# Patient Record
Sex: Female | Born: 1970
Health system: Southern US, Community
[De-identification: ages and names within clinical notes are randomized; demographics above are authoritative.]

## PROBLEM LIST (undated history)

## (undated) DIAGNOSIS — Z789 Other specified health status: Secondary | ICD-10-CM

## (undated) HISTORY — DX: Other specified health status: Z78.9

## (undated) HISTORY — PX: NO PAST SURGERIES: SHX2092

---

## 2016-09-14 DIAGNOSIS — L738 Other specified follicular disorders: Secondary | ICD-10-CM | POA: Diagnosis not present

## 2016-09-14 DIAGNOSIS — L720 Epidermal cyst: Secondary | ICD-10-CM | POA: Diagnosis not present

## 2016-09-14 DIAGNOSIS — D229 Melanocytic nevi, unspecified: Secondary | ICD-10-CM | POA: Diagnosis not present

## 2016-11-04 DIAGNOSIS — R52 Pain, unspecified: Secondary | ICD-10-CM | POA: Diagnosis not present

## 2016-11-30 ENCOUNTER — Other Ambulatory Visit: Payer: Self-pay

## 2016-11-30 DIAGNOSIS — Z0000 Encounter for general adult medical examination without abnormal findings: Secondary | ICD-10-CM

## 2016-12-01 LAB — CMP12+LP+TP+TSH+6AC+CBC/D/PLT
A/G RATIO: 1.9 (ref 1.2–2.2)
ALT: 16 IU/L (ref 0–32)
AST: 15 IU/L (ref 0–40)
Albumin: 4.3 g/dL (ref 3.5–5.5)
Alkaline Phosphatase: 60 IU/L (ref 39–117)
BASOS ABS: 0 10*3/uL (ref 0.0–0.2)
BUN/Creatinine Ratio: 16 (ref 9–23)
BUN: 11 mg/dL (ref 6–24)
Basos: 1 %
Bilirubin Total: <0.2 mg/dL (ref 0.0–1.2)
CHOL/HDL RATIO: 2.9 ratio (ref 0.0–4.4)
CHOLESTEROL TOTAL: 167 mg/dL (ref 100–199)
Calcium: 9.3 mg/dL (ref 8.7–10.2)
Chloride: 105 mmol/L (ref 96–106)
Creatinine, Ser: 0.68 mg/dL (ref 0.57–1.00)
EOS (ABSOLUTE): 0.1 10*3/uL (ref 0.0–0.4)
Eos: 4 %
Estimated CHD Risk: <0.5 times avg. (ref 0.0–1.0)
FREE THYROXINE INDEX: 1.7 (ref 1.2–4.9)
GFR calc non Af Amer: 104 mL/min/{1.73_m2} (ref >59)
GFR, EST AFRICAN AMERICAN: 120 mL/min/{1.73_m2} (ref >59)
GGT: 13 IU/L (ref 0–60)
GLOBULIN, TOTAL: 2.3 g/dL (ref 1.5–4.5)
Glucose: 98 mg/dL (ref 65–99)
HDL: 58 mg/dL (ref >39)
Hematocrit: 38.9 % (ref 34.0–46.6)
Hemoglobin: 13.2 g/dL (ref 11.1–15.9)
IMMATURE GRANS (ABS): 0 10*3/uL (ref 0.0–0.1)
IRON: 48 ug/dL (ref 27–159)
Immature Granulocytes: 0 %
LDH: 101 IU/L — AB (ref 119–226)
LDL Calculated: 98 mg/dL (ref 0–99)
LYMPHS: 28 %
Lymphocytes Absolute: 1 10*3/uL (ref 0.7–3.1)
MCH: 32.2 pg (ref 26.6–33.0)
MCHC: 33.9 g/dL (ref 31.5–35.7)
MCV: 95 fL (ref 79–97)
MONOCYTES: 8 %
MONOS ABS: 0.3 10*3/uL (ref 0.1–0.9)
Neutrophils Absolute: 2.2 10*3/uL (ref 1.4–7.0)
Neutrophils: 59 %
PHOSPHORUS: 3.3 mg/dL (ref 2.5–4.5)
PLATELETS: 193 10*3/uL (ref 150–379)
Potassium: 4.3 mmol/L (ref 3.5–5.2)
RBC: 4.1 x10E6/uL (ref 3.77–5.28)
RDW: 13.2 % (ref 12.3–15.4)
Sodium: 143 mmol/L (ref 134–144)
T3 UPTAKE RATIO: 30 % (ref 24–39)
T4 TOTAL: 5.5 ug/dL (ref 4.5–12.0)
TRIGLYCERIDES: 56 mg/dL (ref 0–149)
TSH: 1.2 u[IU]/mL (ref 0.450–4.500)
Total Protein: 6.6 g/dL (ref 6.0–8.5)
Uric Acid: 3.9 mg/dL (ref 2.5–7.1)
VLDL Cholesterol Cal: 11 mg/dL (ref 5–40)
WBC: 3.6 10*3/uL (ref 3.4–10.8)

## 2016-12-01 LAB — VITAMIN D 25 HYDROXY (VIT D DEFICIENCY, FRACTURES): VIT D 25 HYDROXY: 38.2 ng/mL (ref 30.0–100.0)

## 2016-12-02 LAB — HGB A1C W/O EAG: HEMOGLOBIN A1C: 4.8 % (ref 4.8–5.6)

## 2016-12-02 LAB — SPECIMEN STATUS REPORT

## 2016-12-08 ENCOUNTER — Encounter: Payer: Self-pay | Admitting: Medical

## 2016-12-28 ENCOUNTER — Ambulatory Visit (INDEPENDENT_AMBULATORY_CARE_PROVIDER_SITE_OTHER): Payer: BLUE CROSS/BLUE SHIELD | Admitting: Obstetrics and Gynecology

## 2016-12-28 ENCOUNTER — Encounter: Payer: Self-pay | Admitting: Obstetrics and Gynecology

## 2016-12-28 DIAGNOSIS — Z1211 Encounter for screening for malignant neoplasm of colon: Secondary | ICD-10-CM | POA: Diagnosis not present

## 2016-12-28 DIAGNOSIS — Z1231 Encounter for screening mammogram for malignant neoplasm of breast: Secondary | ICD-10-CM | POA: Diagnosis not present

## 2016-12-28 DIAGNOSIS — Z1239 Encounter for other screening for malignant neoplasm of breast: Secondary | ICD-10-CM

## 2016-12-28 DIAGNOSIS — Z124 Encounter for screening for malignant neoplasm of cervix: Secondary | ICD-10-CM

## 2016-12-28 DIAGNOSIS — Z01419 Encounter for gynecological examination (general) (routine) without abnormal findings: Secondary | ICD-10-CM | POA: Diagnosis not present

## 2016-12-28 NOTE — Patient Instructions (Signed)
Screening recommended starting at age 48 for average risk individuals, age 40 for individuals deemed at increased risk (including African Americans) and recommended to continue until age 75.  For patient age 76-85 individualized approach is recommended.  Gold standard screening is via colonoscopy, Cologuard screening is an acceptable alternative for patient unwilling or unable to undergo colonoscopy.  "Colorectal cancer screening for average?risk adults: 2018 guideline update from the American Cancer Society"CA: A Cancer Journal for Clinicians: Nov 30, 2016    Preventive Care 40-64 Years, Female Preventive care refers to lifestyle choices and visits with your health care provider that can promote health and wellness. What does preventive care include?  A yearly physical exam. This is also called an annual well check.  Dental exams once or twice a year.  Routine eye exams. Ask your health care provider how often you should have your eyes checked.  Personal lifestyle choices, including: ? Daily care of your teeth and gums. ? Regular physical activity. ? Eating a healthy diet. ? Avoiding tobacco and drug use. ? Limiting alcohol use. ? Practicing safe sex. ? Taking low-dose aspirin daily starting at age 50. ? Taking vitamin and mineral supplements as recommended by your health care provider. What happens during an annual well check? The services and screenings done by your health care provider during your annual well check will depend on your age, overall health, lifestyle risk factors, and family history of disease. Counseling Your health care provider may ask you questions about your:  Alcohol use.  Tobacco use.  Drug use.  Emotional well-being.  Home and relationship well-being.  Sexual activity.  Eating habits.  Work and work environment.  Method of birth control.  Menstrual cycle.  Pregnancy history.  Screening You may have the following tests or  measurements:  Height, weight, and BMI.  Blood pressure.  Lipid and cholesterol levels. These may be checked every 5 years, or more frequently if you are over 50 years old.  Skin check.  Lung cancer screening. You may have this screening every year starting at age 55 if you have a 30-pack-year history of smoking and currently smoke or have quit within the past 15 years.  Fecal occult blood test (FOBT) of the stool. You may have this test every year starting at age 50.  Flexible sigmoidoscopy or colonoscopy. You may have a sigmoidoscopy every 5 years or a colonoscopy every 10 years starting at age 50.  Hepatitis C blood test.  Hepatitis B blood test.  Sexually transmitted disease (STD) testing.  Diabetes screening. This is done by checking your blood sugar (glucose) after you have not eaten for a while (fasting). You may have this done every 1-3 years.  Mammogram. This may be done every 1-2 years. Talk to your health care provider about when you should start having regular mammograms. This may depend on whether you have a family history of breast cancer.  BRCA-related cancer screening. This may be done if you have a family history of breast, ovarian, tubal, or peritoneal cancers.  Pelvic exam and Pap test. This may be done every 3 years starting at age 21. Starting at age 30, this may be done every 5 years if you have a Pap test in combination with an HPV test.  Bone density scan. This is done to screen for osteoporosis. You may have this scan if you are at high risk for osteoporosis.  Discuss your test results, treatment options, and if necessary, the need for more tests with your   health care provider. Vaccines Your health care provider may recommend certain vaccines, such as:  Influenza vaccine. This is recommended every year.  Tetanus, diphtheria, and acellular pertussis (Tdap, Td) vaccine. You may need a Td booster every 10 years.  Varicella vaccine. You may need this if  you have not been vaccinated.  Zoster vaccine. You may need this after age 60.  Measles, mumps, and rubella (MMR) vaccine. You may need at least one dose of MMR if you were born in 1957 or later. You may also need a second dose.  Pneumococcal 13-valent conjugate (PCV13) vaccine. You may need this if you have certain conditions and were not previously vaccinated.  Pneumococcal polysaccharide (PPSV23) vaccine. You may need one or two doses if you smoke cigarettes or if you have certain conditions.  Meningococcal vaccine. You may need this if you have certain conditions.  Hepatitis A vaccine. You may need this if you have certain conditions or if you travel or work in places where you may be exposed to hepatitis A.  Hepatitis B vaccine. You may need this if you have certain conditions or if you travel or work in places where you may be exposed to hepatitis B.  Haemophilus influenzae type b (Hib) vaccine. You may need this if you have certain conditions.  Talk to your health care provider about which screenings and vaccines you need and how often you need them. This information is not intended to replace advice given to you by your health care provider. Make sure you discuss any questions you have with your health care provider. Document Released: 07/17/2015 Document Revised: 03/09/2016 Document Reviewed: 04/21/2015 Elsevier Interactive Patient Education  2017 Elsevier Inc.  

## 2016-12-28 NOTE — Progress Notes (Addendum)
Patient ID: Wanda Williamson, female   DOB: 07/18/1970, 46 y.o.   MRN: [id number]     Gynecology Annual Exam  PCP: Doy Minceatcliffe, Heather R, PA-C  Chief Complaint:  Chief Complaint  Patient presents with  . Gynecologic Exam    discuss perimenopause    History of Present Illness: Patient is a 46 y.o. G0P0000 presents for annual exam. The patient has no complaints today.   LMP: No LMP recorded. Patient is not currently having periods (Reason: Perimenopausal). She has noted some mild vasomotor symptoms.  Periods have spaced out to about every 3-4 months, when they occur lasting 5-6 days, normal flow, no intermenstrual spotting.    The patient is sexually active. She currently uses nothing for contraception. She denies dyspareunia.  The patient does perform self breast exams.  There is no notable family history of breast or ovarian cancer in her family.  The patient wears seatbelts: yes.   The patient has regular exercise: yes.    The patient denies current symptoms of depression.    Review of Systems: Review of Systems  Constitutional: Negative for chills and fever.  HENT: Negative for congestion.   Respiratory: Negative for cough and shortness of breath.   Cardiovascular: Negative for chest pain and palpitations.  Gastrointestinal: Negative for abdominal pain, constipation, diarrhea, heartburn, nausea and vomiting.  Genitourinary: Negative for dysuria, frequency and urgency.  Skin: Negative for itching and rash.  Neurological: Negative for dizziness and headaches.  Endo/Heme/Allergies: Negative for polydipsia.  Psychiatric/Behavioral: Negative for depression.    Past Medical History:  History reviewed. No pertinent past medical history.  Past Surgical History:  History reviewed. No pertinent surgical history.  Gynecologic History:  No LMP recorded. Patient is not currently having periods (Reason: Perimenopausal).  Obstetric History: G0P0000  Family History:  History reviewed.  No pertinent family history.  Social History:  Social History   Social History  . Marital status: Single    Spouse name: N/A  . Number of children: N/A  . Years of education: N/A   Occupational History  . Not on file.   Social History Main Topics  . Smoking status: Current Every Day Smoker    Types: Cigarettes  . Smokeless tobacco: Never Used  . Alcohol use Yes  . Drug use: No  . Sexual activity: Yes    Partners: Male   Other Topics Concern  . Not on file   Social History Narrative  . No narrative on file    Allergies:  Allergies  Allergen Reactions  . Cat Hair Extract Rash  . Penicillin G Rash    Medications: Prior to Admission medications   Not on File    Physical Exam Vitals: Blood pressure 122/70, pulse 83, height 5\' 10"  (1.778 m), weight 151 lb (68.5 kg).  General: NAD HEENT: normocephalic, anicteric Thyroid: no enlargement, no palpable nodules Pulmonary: No increased work of breathing, CTAB Cardiovascular: RRR, distal pulses 2+ Breast: Breast symmetrical, no tenderness, no palpable nodules or masses, no skin or nipple retraction present, no nipple discharge.  No axillary or supraclavicular lymphadenopathy. Abdomen: NABS, soft, non-tender, non-distended.  Umbilicus without lesions.  No hepatomegaly, splenomegaly or masses palpable. No evidence of hernia  Genitourinary:  External: Normal external female genitalia.  Normal urethral meatus, normal  Bartholin's and Skene's glands.    Vagina: Normal vaginal mucosa, no evidence of prolapse.    Cervix: Grossly normal in appearance, no bleeding  Uterus: Non-enlarged, mobile, normal contour.  No CMT  Adnexa: ovaries non-enlarged, no adnexal  masses  Rectal: deferred  Lymphatic: no evidence of inguinal lymphadenopathy Extremities: no edema, erythema, or tenderness Neurologic: Grossly intact Psychiatric: mood appropriate, affect full  Female chaperone present for pelvic and breast  portions of the physical  exam    Assessment: 46 y.o. routine annual exam Plan: Problem List Items Addressed This Visit    None    Visit Diagnoses    Screening for malignant neoplasm of cervix       Relevant Orders   PapIG, HPV, rfx 16/18   Breast screening       Relevant Orders   MM DIGITAL SCREENING BILATERAL   Special screening for malignant neoplasms, colon       Encounter for gynecological examination without abnormal finding       Relevant Orders   PapIG, HPV, rfx 16/18      1) Mammogram - recommend yearly screening mammogram.  Mammogram Was ordered today  2) ASCCP guidelines and rational discussed.  Patient opts for every 3 years screening interval  3) Colonoscopy -- Screening recommended starting at age 44 for average risk individuals, age 59 for individuals deemed at increased risk (including African Americans) and recommended to continue until age 42.  For patient age 16-85 individualized approach is recommended.  Gold standard screening is via colonoscopy, Cologuard screening is an acceptable alternative for patient unwilling or unable to undergo colonoscopy.  "Colorectal cancer screening for average?risk adults: 2018 guideline update from the American Cancer Society"CA: A Cancer Journal for Clinicians: Nov 30, 2016  - interested in cologuard  4) Routine healthcare maintenance including cholesterol, diabetes screening discussed managed by PCP   5) Follow up 1 year for repeat Annual

## 2017-01-03 ENCOUNTER — Encounter: Payer: Self-pay | Admitting: Obstetrics and Gynecology

## 2017-01-03 LAB — PAPIG, HPV, RFX 16/18
HPV, high-risk: NEGATIVE
PAP SMEAR COMMENT: 0

## 2017-07-12 DIAGNOSIS — Z1329 Encounter for screening for other suspected endocrine disorder: Secondary | ICD-10-CM | POA: Diagnosis not present

## 2017-07-12 DIAGNOSIS — Z0000 Encounter for general adult medical examination without abnormal findings: Secondary | ICD-10-CM | POA: Diagnosis not present

## 2017-07-12 DIAGNOSIS — Z1231 Encounter for screening mammogram for malignant neoplasm of breast: Secondary | ICD-10-CM | POA: Diagnosis not present

## 2017-07-12 DIAGNOSIS — Z13220 Encounter for screening for lipoid disorders: Secondary | ICD-10-CM | POA: Diagnosis not present

## 2017-07-17 ENCOUNTER — Other Ambulatory Visit: Payer: Self-pay | Admitting: Internal Medicine

## 2017-07-17 DIAGNOSIS — Z1231 Encounter for screening mammogram for malignant neoplasm of breast: Secondary | ICD-10-CM

## 2017-08-01 ENCOUNTER — Ambulatory Visit
Admission: RE | Admit: 2017-08-01 | Discharge: 2017-08-01 | Disposition: A | Discharge status: Home / Self Care | Payer: BLUE CROSS/BLUE SHIELD | Source: Ambulatory Visit | Attending: Internal Medicine | Admitting: Internal Medicine

## 2017-08-01 ENCOUNTER — Encounter: Payer: Self-pay | Admitting: Radiology

## 2017-08-01 DIAGNOSIS — Z1231 Encounter for screening mammogram for malignant neoplasm of breast: Secondary | ICD-10-CM | POA: Diagnosis not present

## 2017-12-29 DIAGNOSIS — M47816 Spondylosis without myelopathy or radiculopathy, lumbar region: Secondary | ICD-10-CM | POA: Diagnosis not present

## 2017-12-29 DIAGNOSIS — M5489 Other dorsalgia: Secondary | ICD-10-CM | POA: Diagnosis not present

## 2018-05-08 DIAGNOSIS — R202 Paresthesia of skin: Secondary | ICD-10-CM | POA: Diagnosis not present

## 2018-05-08 DIAGNOSIS — M25511 Pain in right shoulder: Secondary | ICD-10-CM | POA: Diagnosis not present

## 2018-05-08 DIAGNOSIS — M4802 Spinal stenosis, cervical region: Secondary | ICD-10-CM | POA: Diagnosis not present

## 2018-05-21 DIAGNOSIS — S46911A Strain of unspecified muscle, fascia and tendon at shoulder and upper arm level, right arm, initial encounter: Secondary | ICD-10-CM | POA: Diagnosis not present

## 2018-05-21 DIAGNOSIS — R200 Anesthesia of skin: Secondary | ICD-10-CM | POA: Diagnosis not present

## 2018-07-03 ENCOUNTER — Other Ambulatory Visit: Payer: Self-pay | Admitting: Obstetrics and Gynecology

## 2018-07-03 DIAGNOSIS — Z1231 Encounter for screening mammogram for malignant neoplasm of breast: Secondary | ICD-10-CM

## 2018-07-20 ENCOUNTER — Ambulatory Visit (INDEPENDENT_AMBULATORY_CARE_PROVIDER_SITE_OTHER): Payer: BLUE CROSS/BLUE SHIELD | Admitting: Obstetrics and Gynecology

## 2018-07-20 ENCOUNTER — Other Ambulatory Visit (HOSPITAL_COMMUNITY)
Admission: RE | Admit: 2018-07-20 | Discharge: 2018-07-20 | Disposition: A | Discharge status: Home / Self Care | Payer: BLUE CROSS/BLUE SHIELD | Source: Ambulatory Visit | Attending: Obstetrics and Gynecology | Admitting: Obstetrics and Gynecology

## 2018-07-20 ENCOUNTER — Encounter: Payer: Self-pay | Admitting: Obstetrics and Gynecology

## 2018-07-20 VITALS — BP 110/62 | HR 76 | Ht 70.5 in | Wt 141.0 lb

## 2018-07-20 DIAGNOSIS — Z13220 Encounter for screening for lipoid disorders: Secondary | ICD-10-CM | POA: Diagnosis not present

## 2018-07-20 DIAGNOSIS — Z01419 Encounter for gynecological examination (general) (routine) without abnormal findings: Secondary | ICD-10-CM

## 2018-07-20 DIAGNOSIS — Z1239 Encounter for other screening for malignant neoplasm of breast: Secondary | ICD-10-CM

## 2018-07-20 DIAGNOSIS — Z124 Encounter for screening for malignant neoplasm of cervix: Secondary | ICD-10-CM | POA: Diagnosis not present

## 2018-07-20 DIAGNOSIS — Z0001 Encounter for general adult medical examination with abnormal findings: Secondary | ICD-10-CM | POA: Diagnosis not present

## 2018-07-20 NOTE — Progress Notes (Signed)
Gynecology Annual Exam  PCP: Patient, No Pcp Per  Chief Complaint:  Chief Complaint  Patient presents with  . Gynecologic Exam    Pain around right breast/armpit    History of Present Illness: Patient is a 48 y.o. G0P0000 presents for annual exam. The patient has no complaints today.   LMP: No LMP recorded. Patient is postmenopausal. No postmenopausal bleeding, no significant vasomotor symptoms   The patient does perform self breast exams.  There is no notable family history of breast or ovarian cancer in her family.  The patient wears seatbelts: yes.   The patient has regular exercise: not asked.    The patient denies current symptoms of depression.    Review of Systems: Review of Systems  Constitutional: Negative for chills and fever.  HENT: Negative for congestion.   Respiratory: Negative for cough and shortness of breath.   Cardiovascular: Negative for chest pain and palpitations.  Gastrointestinal: Negative for abdominal pain, constipation, diarrhea, heartburn, nausea and vomiting.  Genitourinary: Negative for dysuria, frequency and urgency.  Skin: Negative for itching and rash.  Neurological: Negative for dizziness and headaches.  Endo/Heme/Allergies: Negative for polydipsia.  Psychiatric/Behavioral: Negative for depression.    Past Medical History:  Past Medical History:  Diagnosis Date  . No known health problems     Past Surgical History:  Past Surgical History:  Procedure Laterality Date  . NO PAST SURGERIES      Gynecologic History:  No LMP recorded. Patient is postmenopausal. Contraception: post menopausal status Last Pap: Results were: 12/28/2016 NIL and HR HPV negative  Last mammogram: 08/01/2017 Results were: BI-RAD I  Obstetric History: G0P0000  Family History:  History reviewed. No pertinent family history.  Social History:  Social History   Socioeconomic History  . Marital status: Single    Spouse name: Not on file  . Number of  children: Not on file  . Years of education: Not on file  . Highest education level: Not on file  Occupational History  . Not on file  Social Needs  . Financial resource strain: Not on file  . Food insecurity:    Worry: Not on file    Inability: Not on file  . Transportation needs:    Medical: Not on file    Non-medical: Not on file  Tobacco Use  . Smoking status: Current Every Day Smoker    Types: Cigarettes  . Smokeless tobacco: Never Used  Substance and Sexual Activity  . Alcohol use: Yes  . Drug use: No  . Sexual activity: Yes    Partners: Male  Lifestyle  . Physical activity:    Days per week: Not on file    Minutes per session: Not on file  . Stress: Not on file  Relationships  . Social connections:    Talks on phone: Not on file    Gets together: Not on file    Attends religious service: Not on file    Active member of club or organization: Not on file    Attends meetings of clubs or organizations: Not on file    Relationship status: Not on file  . Intimate partner violence:    Fear of current or ex partner: Not on file    Emotionally abused: Not on file    Physically abused: Not on file    Forced sexual activity: Not on file  Other Topics Concern  . Not on file  Social History Narrative  . Not on file  Allergies:  Allergies  Allergen Reactions  . Cat Hair Extract Rash  . Penicillin G Rash    Medications: Prior to Admission medications   Not on File    Physical Exam Vitals: Blood pressure 110/62, pulse 76, height 5' 10.5" (1.791 m), weight 141 lb (64 kg). Body mass index is 19.95 kg/m. Weight last year 151lbs  General: NAD HEENT: normocephalic, anicteric Thyroid: no enlargement, no palpable nodules Pulmonary: No increased work of breathing, CTAB Cardiovascular: RRR, distal pulses 2+ Breast: Breast symmetrical, no tenderness, no palpable nodules or masses, no skin or nipple retraction present, no nipple discharge.  No axillary or  supraclavicular lymphadenopathy. Abdomen: NABS, soft, non-tender, non-distended.  Umbilicus without lesions.  No hepatomegaly, splenomegaly or masses palpable. No evidence of hernia  Genitourinary:  External: Normal external female genitalia.  Normal urethral meatus, normal Bartholin's and Skene's glands.    Vagina: Normal vaginal mucosa, no evidence of prolapse.    Cervix: Grossly normal in appearance, no bleeding  Uterus: Non-enlarged, mobile, normal contour.  No CMT  Adnexa: ovaries non-enlarged, no adnexal masses  Rectal: deferred  Lymphatic: no evidence of inguinal lymphadenopathy Extremities: no edema, erythema, or tenderness Neurologic: Grossly intact Psychiatric: mood appropriate, affect full  Female chaperone present for pelvic and breast  portions of the physical exam    Assessment: 48 y.o. G0P0000 routine annual exam  Plan: Problem List Items Addressed This Visit    None    Visit Diagnoses    Encounter for gynecological examination without abnormal finding    -  Primary   Breast screening       Relevant Orders   MM 3D SCREEN BREAST BILATERAL   Screening for malignant neoplasm of cervix       Relevant Orders   Cytology - PAP      1) Mammogram - recommend yearly screening mammogram.  Mammogram Was ordered today   2) STI screening  was notoffered and therefore not obtained  3) ASCCP guidelines and rational discussed.  Patient opts for every 3 years screening interval  4) Contraception - the patient is currently using  post menopausal status.  She is not currently in need of contraception secondary to being sterile  5) Colonoscopy -- discussed obtaining this year will likely get in [place] where she plan to move back to this summer  6) Routine healthcare maintenance including cholesterol, diabetes screening discussed managed by PCP  7) Return in about 1 year (around 07/21/2019) for annual.   Vena Austria, MD, [facility], [facility] 07/20/2018, 1:44 PM

## 2018-07-20 NOTE — Patient Instructions (Signed)
[facility] [address] [place] [address]  [facility]  [address] [place] [address]  Phone: [phone]  

## 2018-07-25 LAB — CYTOLOGY - PAP
Adequacy: ABSENT
Diagnosis: NEGATIVE
HPV: NOT DETECTED

## 2018-08-01 DIAGNOSIS — D485 Neoplasm of uncertain behavior of skin: Secondary | ICD-10-CM | POA: Diagnosis not present

## 2018-08-09 ENCOUNTER — Ambulatory Visit
Admission: RE | Admit: 2018-08-09 | Discharge: 2018-08-09 | Disposition: A | Discharge status: Home / Self Care | Payer: BLUE CROSS/BLUE SHIELD | Source: Ambulatory Visit | Attending: Obstetrics and Gynecology | Admitting: Obstetrics and Gynecology

## 2018-08-09 DIAGNOSIS — Z1231 Encounter for screening mammogram for malignant neoplasm of breast: Secondary | ICD-10-CM

## 2018-08-09 DIAGNOSIS — Z1239 Encounter for other screening for malignant neoplasm of breast: Secondary | ICD-10-CM

## 2019-12-02 IMAGING — MG DIGITAL SCREENING BILATERAL MAMMOGRAM WITH TOMO AND CAD
8 series · 9 of 24 positions shown · non-contrast
Comparison: Previous exam(s).

CLINICAL DATA: Screening.

EXAM:
DIGITAL SCREENING BILATERAL MAMMOGRAM WITH TOMO AND CAD

[L CC synth-2D]
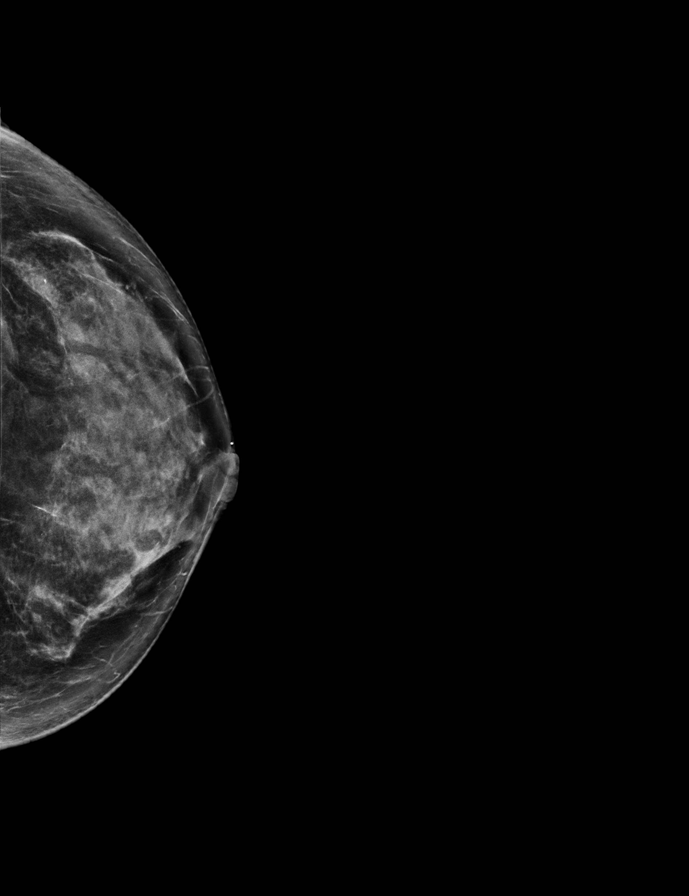

[R CC synth-2D]
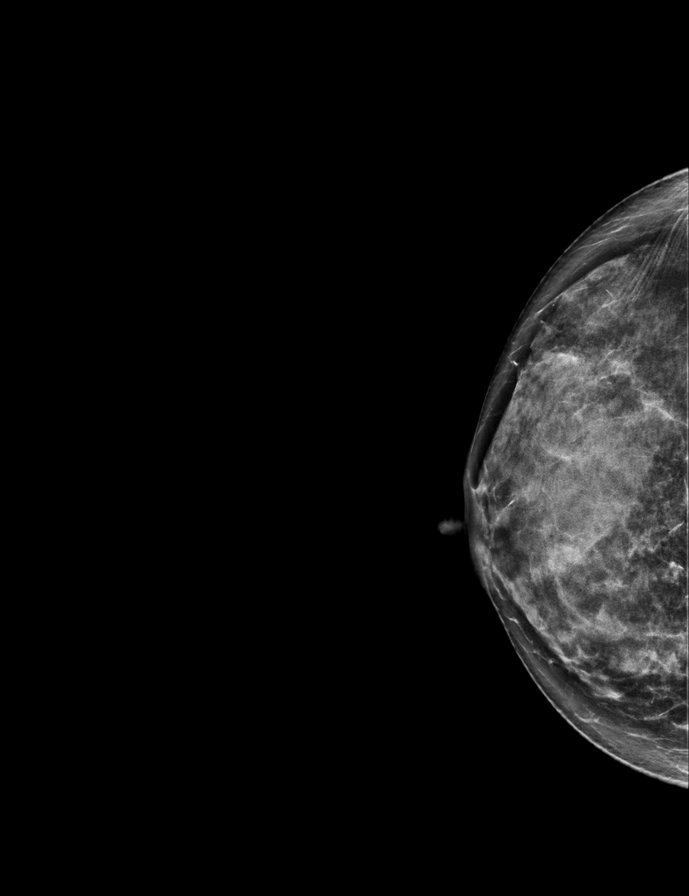

[R MLO synth-2D]
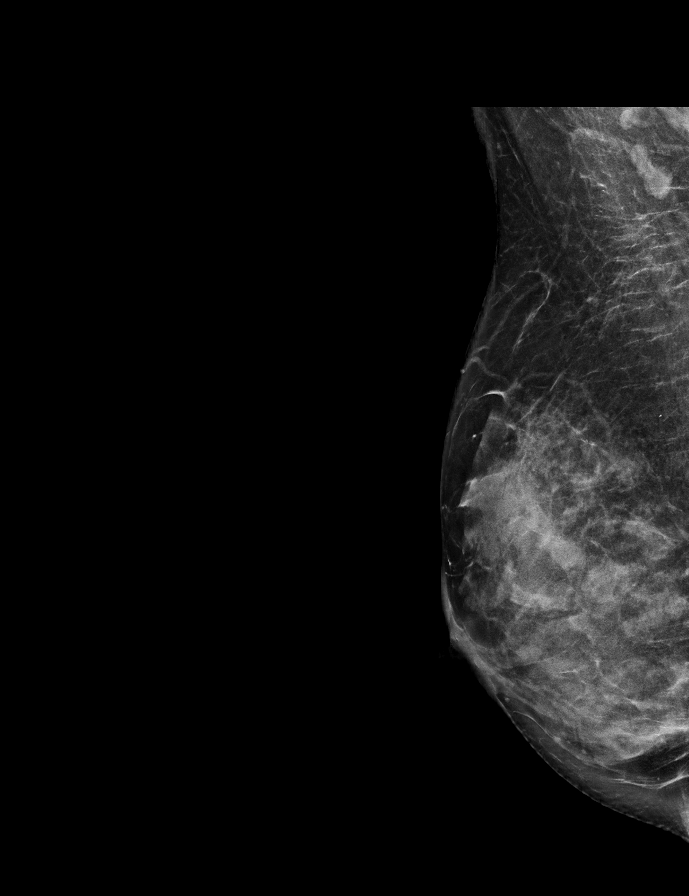

[L MLO synth-2D]
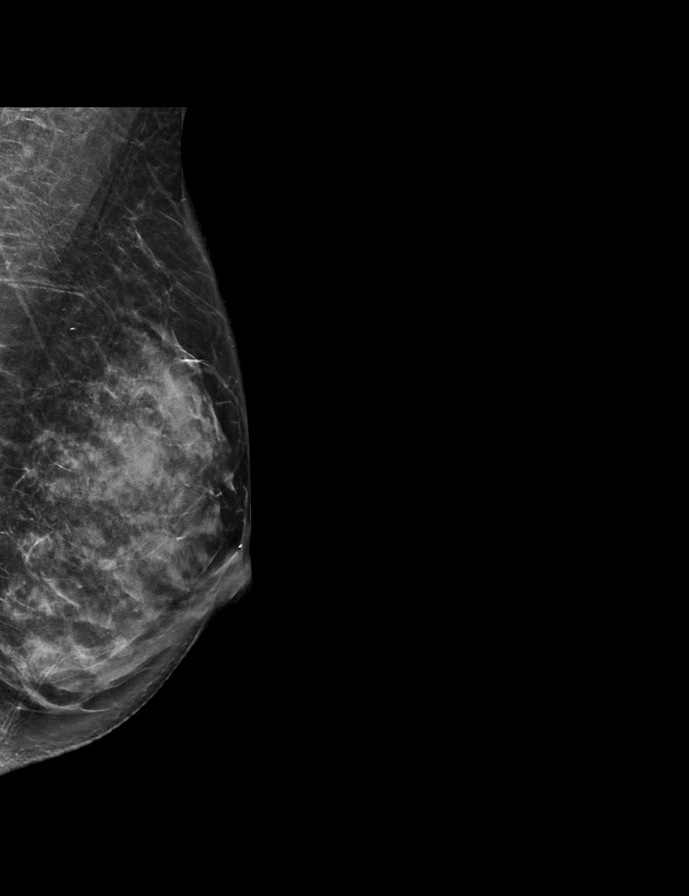

[R MLO tomo · 2 of 68 frames shown]
[frame 22/68]
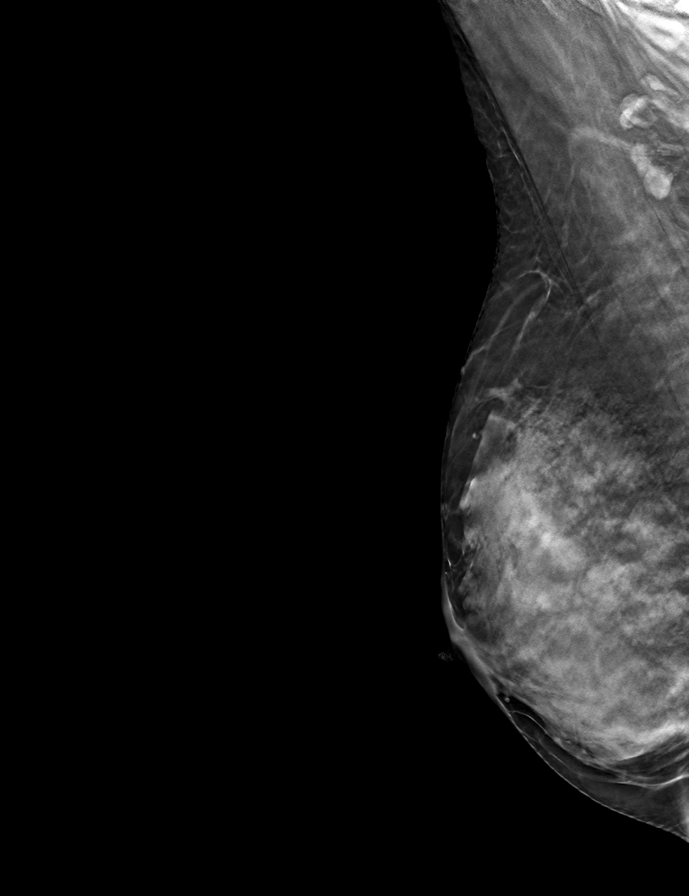
[frame 35/68]
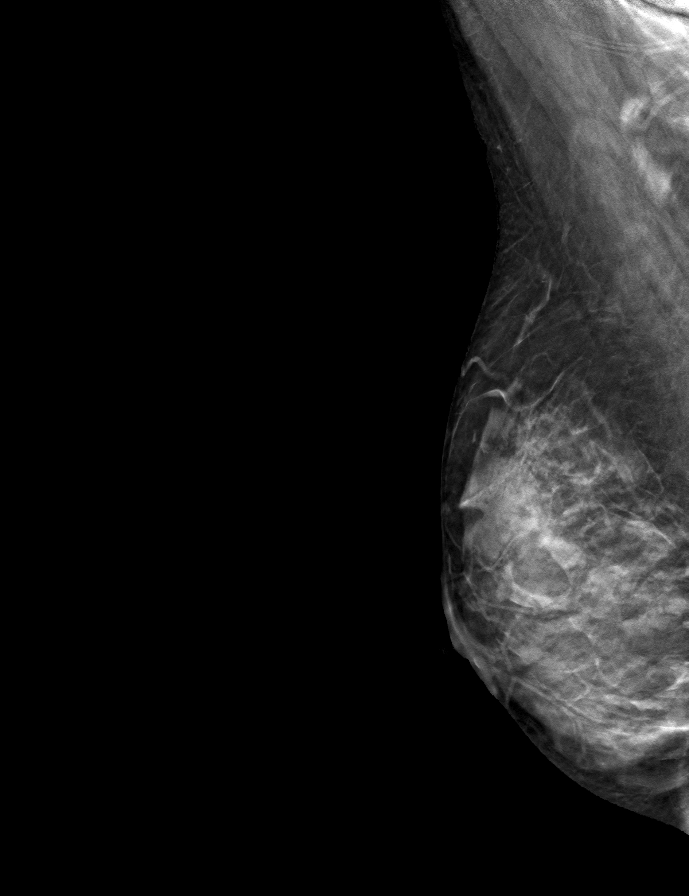

[L MLO tomo · tomo slice 33/66.0]
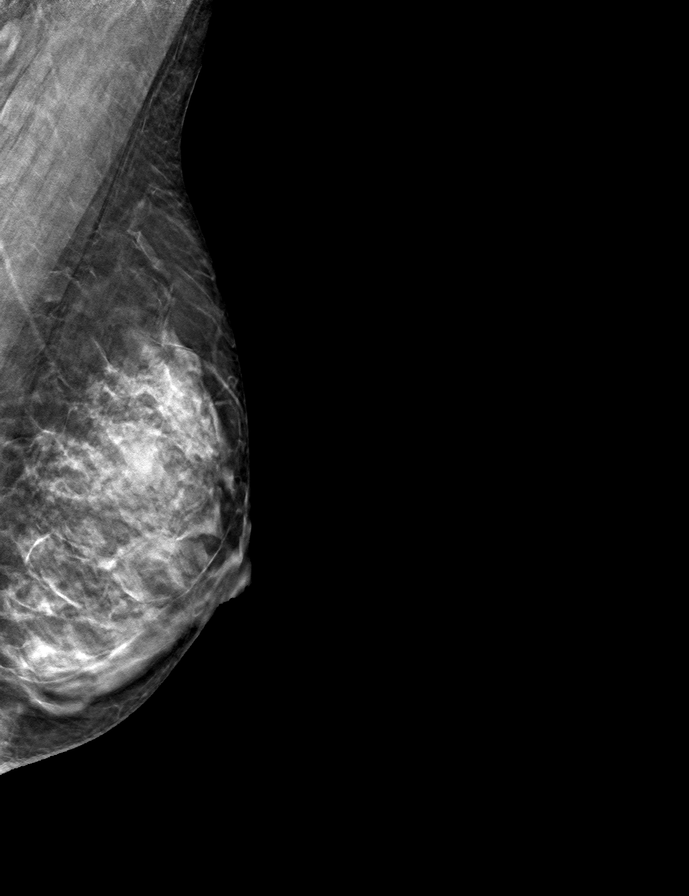

[L CC tomo · tomo slice 33/66.0]
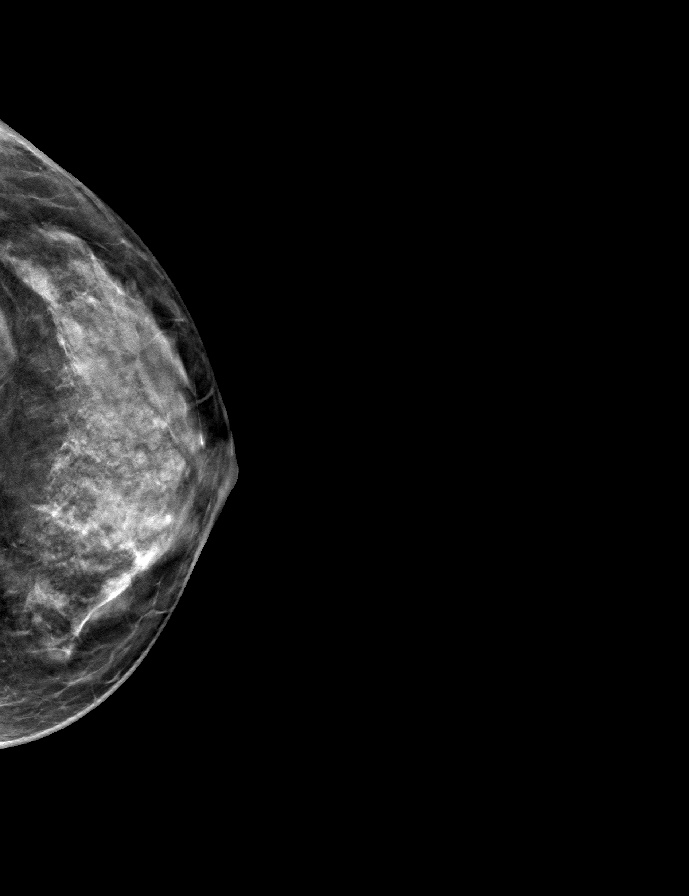

[R CC tomo · tomo slice 31/62.0]
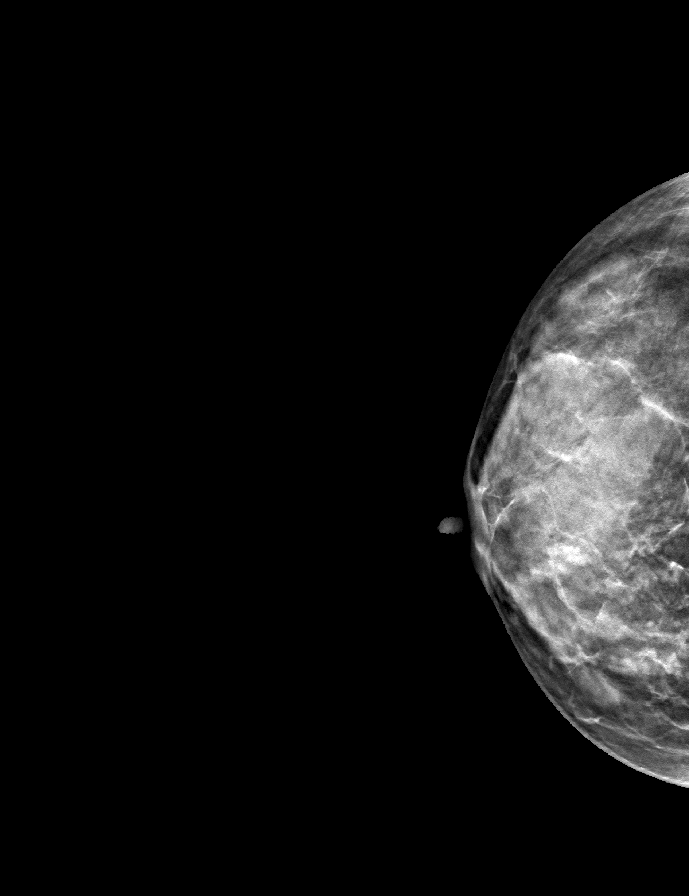

[9 of 24 positions shown; findings below may reference images not displayed]

ACR Breast Density Category c: The breast tissue is heterogeneously
dense, which may obscure small masses.
FINDINGS: There are no findings suspicious for malignancy. Images were
processed with CAD.
IMPRESSION: No mammographic evidence of malignancy. A result letter of this
screening mammogram will be mailed directly to the patient.

RECOMMENDATION:
Screening mammogram in one year. (Code:[id number]-U-LHB)

BI-RADS CATEGORY  1: Negative.
